# Patient Record
Sex: Male | Born: 1975 | Race: White | Hispanic: No | Marital: Single | Smoking: Former smoker
Health system: Southern US, Community
[De-identification: ages and names within clinical notes are randomized; demographics above are authoritative.]

## PROBLEM LIST (undated history)

## (undated) DIAGNOSIS — R079 Chest pain, unspecified: Secondary | ICD-10-CM

## (undated) DIAGNOSIS — J40 Bronchitis, not specified as acute or chronic: Secondary | ICD-10-CM

## (undated) DIAGNOSIS — I493 Ventricular premature depolarization: Secondary | ICD-10-CM

## (undated) DIAGNOSIS — K311 Adult hypertrophic pyloric stenosis: Secondary | ICD-10-CM

## (undated) HISTORY — DX: Ventricular premature depolarization: I49.3

## (undated) HISTORY — PX: ABDOMINAL SURGERY: SHX537

## (undated) HISTORY — DX: Bronchitis, not specified as acute or chronic: J40

## (undated) HISTORY — DX: Chest pain, unspecified: R07.9

---

## 2011-03-23 ENCOUNTER — Ambulatory Visit: Payer: Self-pay | Admitting: Internal Medicine

## 2011-03-23 ENCOUNTER — Ambulatory Visit: Payer: Self-pay

## 2011-03-23 VITALS — BP 122/86 | HR 89 | Temp 98.1°F | Resp 18 | Ht 66.0 in | Wt 182.0 lb

## 2011-03-23 DIAGNOSIS — R05 Cough: Secondary | ICD-10-CM

## 2011-03-23 DIAGNOSIS — R059 Cough, unspecified: Secondary | ICD-10-CM

## 2011-03-23 DIAGNOSIS — J40 Bronchitis, not specified as acute or chronic: Secondary | ICD-10-CM

## 2011-03-23 MED ORDER — AZITHROMYCIN 500 MG PO TABS: 500.0000 mg | ORAL_TABLET | Freq: Every day | ORAL | Status: AC

## 2011-03-23 MED ORDER — BENZONATATE 100 MG PO CAPS: 100.0000 mg | ORAL_CAPSULE | Freq: Three times a day (TID) | ORAL | Status: AC | PRN

## 2011-03-23 NOTE — Patient Instructions (Signed)
zithromax 500 mg 1 tab daily for 5 days. Tessalon perle 1 tab every 8 hours for cough.

## 2011-03-23 NOTE — Progress Notes (Signed)
  Subjective:    Patient ID: Kristopher Peterson, male    DOB: [birth year], 36 y.o.   MRN: [id number]  HPIcough Onset 3 months ago cough no fever no nv chills sputum is brown and green stopped smoking 2 months ago because of the cough.no weight loss. No night sweats. No chest pain.  Review of Systems  Constitutional: Positive for fatigue.  HENT: Negative.   Eyes: Negative.   Respiratory: Positive for cough.   Cardiovascular: Negative.   Gastrointestinal: Negative.   Genitourinary: Negative.   Musculoskeletal: Negative.   Skin: Negative.   Neurological: Negative.   Hematological: Negative.   Psychiatric/Behavioral: Negative.   All other systems reviewed and are negative.       Objective:   Physical Exam  Constitutional: He is oriented to person, place, and time. He appears well-developed and well-nourished.  HENT:  Head: Normocephalic and atraumatic.  Eyes: Conjunctivae and EOM are normal. Pupils are equal, round, and reactive to light.  Neck: Normal range of motion. Neck supple.  Cardiovascular: Normal rate, regular rhythm and normal heart sounds.   Pulmonary/Chest: Effort normal.       Coarse rhonchi bilat no wheezes  Abdominal: Soft. Bowel sounds are normal.  Musculoskeletal: Normal range of motion.  Neurological: He is alert and oriented to person, place, and time.  Skin: Skin is warm and dry.  Psychiatric: He has a normal mood and affect. His behavior is normal. Judgment and thought content normal.    [facility] reading (PRIMARY) by  Dr. Mindi Junker.increased bronchilal markings rll.  bronchitus.       Assessment & Plan:  Cough Xray Antibiotics Antitussives.

## 2011-10-11 ENCOUNTER — Emergency Department (HOSPITAL_COMMUNITY): Payer: Self-pay

## 2011-10-11 ENCOUNTER — Emergency Department (HOSPITAL_COMMUNITY)
Admission: EM | Admit: 2011-10-11 | Discharge: 2011-10-11 | Disposition: A | Discharge status: Home / Self Care | Payer: Self-pay | Attending: Emergency Medicine | Admitting: Emergency Medicine

## 2011-10-11 ENCOUNTER — Encounter (HOSPITAL_COMMUNITY): Payer: Self-pay | Admitting: *Deleted

## 2011-10-11 DIAGNOSIS — Z87891 Personal history of nicotine dependence: Secondary | ICD-10-CM | POA: Insufficient documentation

## 2011-10-11 DIAGNOSIS — R079 Chest pain, unspecified: Secondary | ICD-10-CM | POA: Insufficient documentation

## 2011-10-11 HISTORY — DX: Adult hypertrophic pyloric stenosis: K31.1

## 2011-10-11 LAB — URINALYSIS, ROUTINE W REFLEX MICROSCOPIC
Ketones, ur: NEGATIVE mg/dL
Leukocytes, UA: NEGATIVE
Nitrite: NEGATIVE
Protein, ur: NEGATIVE mg/dL
Urobilinogen, UA: 0.2 mg/dL (ref 0.0–1.0)

## 2011-10-11 LAB — CBC
HCT: 38.7 % — ABNORMAL LOW (ref 39.0–52.0)
Hemoglobin: 13.6 g/dL (ref 13.0–17.0)
MCHC: 35.1 g/dL (ref 30.0–36.0)

## 2011-10-11 LAB — COMPREHENSIVE METABOLIC PANEL
Alkaline Phosphatase: 79 U/L (ref 39–117)
BUN: 18 mg/dL (ref 6–23)
Chloride: 104 mEq/L (ref 96–112)
GFR calc Af Amer: >90 mL/min (ref >90)
Glucose, Bld: 108 mg/dL — ABNORMAL HIGH (ref 70–99)
Potassium: 3.9 mEq/L (ref 3.5–5.1)
Total Bilirubin: 0.6 mg/dL (ref 0.3–1.2)
Total Protein: 7.2 g/dL (ref 6.0–8.3)

## 2011-10-11 MED ORDER — SODIUM CHLORIDE 0.9 % IV SOLN
INTRAVENOUS | Status: DC
  Administered 2011-10-11: 16:00:00 via INTRAVENOUS

## 2011-10-11 MED ORDER — MORPHINE SULFATE 4 MG/ML IJ SOLN
4.0000 mg | Freq: Once | INTRAMUSCULAR | Status: AC
  Administered 2011-10-11: 4 mg via INTRAVENOUS
  Filled 2011-10-11: qty 1

## 2011-10-11 MED ORDER — SODIUM CHLORIDE 0.9 % IV BOLUS (SEPSIS)
1000.0000 mL | Freq: Once | INTRAVENOUS | Status: AC
  Administered 2011-10-11: 1000 mL via INTRAVENOUS

## 2011-10-11 MED ORDER — NITROGLYCERIN 0.4 MG SL SUBL
0.4000 mg | SUBLINGUAL_TABLET | SUBLINGUAL | Status: DC | PRN
  Administered 2011-10-11 (×2): 0.4 mg via SUBLINGUAL
  Filled 2011-10-11: qty 25

## 2011-10-11 MED ORDER — ASPIRIN 81 MG PO CHEW
324.0000 mg | CHEWABLE_TABLET | Freq: Once | ORAL | Status: AC
  Administered 2011-10-11: 324 mg via ORAL
  Filled 2011-10-11: qty 4

## 2011-10-11 MED ORDER — NITROGLYCERIN 2 % TD OINT
1.0000 [in_us] | TOPICAL_OINTMENT | Freq: Once | TRANSDERMAL | Status: AC
  Administered 2011-10-11: 1 [in_us] via TOPICAL
  Filled 2011-10-11: qty 30

## 2011-10-11 NOTE — ED Provider Notes (Signed)
History     CSN: [id number]  Arrival date & time 10/11/11  1426   First MD Initiated Contact with Patient 10/11/11 1507      Chief Complaint  Patient presents with  . Chest Pain    (Consider location/radiation/quality/duration/timing/severity/associated sxs/prior treatment) HPI Patient is a 36 year old male who presents today complaining of 5/10 chest pain that began approximately 2 hours prior to arrival. Patient describes this as a tightness. He has no associated shortness of breath, nausea, vomiting, or radiation of his pain. Patient has not seen a physician in 13 years. He is not a smoker. Patient reports prior drug use but none recently. Patient does have strong family history of coronary artery disease with both his father and his father's 3 brothers dying of coronary artery disease before the age of 30 and a brother who is 39 who recently had bypass. The patient's knowledge she does not have hypertension, hyperlipidemia, or diabetes. Patient has no history of blood clots in his legs or his lungs. He has been having his pain on and off over the past 2 weeks. Patient has had a recent long car trip from [place] and noted the chest pain during that time. Pain can began just sitting at rest. Patient is hypertensive and tachycardic upon arrival. There are no other associated or modifying factors. Past Medical History  Diagnosis Date  . Pyloric stenosis     Past Surgical History  Procedure Date  . Abdominal surgery     Family History  Problem Relation Age of Onset  . Heart failure Father   . Heart failure Brother     History  Substance Use Topics  . Smoking status: Former Smoker    Quit date: 02/20/2011  . Smokeless tobacco: Current User  . Alcohol Use: Yes     1-2 beers daily      Review of Systems  Constitutional: Positive for fatigue.  HENT: Negative.   Eyes: Negative.   Respiratory: Negative.   Cardiovascular: Positive for chest pain.  Gastrointestinal:  Negative.   Genitourinary: Negative.   Musculoskeletal: Negative.   Skin: Negative.   Neurological: Negative.   Hematological: Negative.   Psychiatric/Behavioral: Negative.   All other systems reviewed and are negative.    Allergies  Review of patient's allergies indicates no known allergies.  Home Medications  No current outpatient prescriptions on file.  BP 143/101  Pulse 122  Temp 98.2 F (36.8 C) (Oral)  Resp 16  SpO2 100%  Physical Exam  Nursing note and vitals reviewed. GEN: Well-developed, well-nourished male in mild distress HEENT: Atraumatic, normocephalic.  EYES: PERRLA BL, no scleral icterus. NECK: Trachea midline, no meningismus CV: Tachycardic with regular rhythm. No murmurs, rubs, or gallops PULM: No respiratory distress.  No crackles, wheezes, or rales. GI: soft, non-tender. No guarding, rebound, or tenderness. + bowel sounds  GU: deferred Neuro: cranial nerves grossly 2-12 intact, no abnormalities of strength or sensation, A and O x 3 MSK: Patient moves all 4 extremities symmetrically, no deformity, edema, or injury noted Skin: No rashes petechiae, purpura, or jaundice Psych: Anxious  ED Course  Procedures (including critical care time)  Indication: chest pain Please note this EKG was reviewed extemporaneously by myself.   Date: 10/11/2011  Rate: 117  Rhythm: normal sinus rhythm  QRS Axis: normal  Intervals: normal  ST/T Wave abnormalities: normal  Conduction Disutrbances:none  Narrative Interpretation:   Old EKG Reviewed: none available    . Labs Reviewed  CBC - Abnormal; Notable for  the following:    HCT 38.7 (*)     All other components within normal limits  COMPREHENSIVE METABOLIC PANEL - Abnormal; Notable for the following:    Glucose, Bld 108 (*)     GFR calc non Af Amer 82 (*)     All other components within normal limits  URINALYSIS, ROUTINE W REFLEX MICROSCOPIC  D-DIMER, QUANTITATIVE  POCT I-STAT TROPONIN I  POCT I-STAT  TROPONIN I   Dg Chest Portable 1 View  10/11/2011  *RADIOLOGY REPORT*  Clinical Data: Chest pain.  PORTABLE CHEST - 1 VIEW  Comparison: 03/23/2011.  Findings: The cardiac silhouette, mediastinal and hilar contours are within normal limits.  The lungs are clear.  No pleural effusion.  The bony thorax is intact.  IMPRESSION: No acute cardiopulmonary findings.   Original Report Authenticated By: P. Loralie Champagne, M.D.      1. Chest pain       MDM  Patient was evaluated by myself. Prior to my valuation nursing chart he provided 325 mg of aspirin by mouth. Based on patient's family history as well as his presentation sublingual nitroglycerin was given. EKG was reviewed and did not show an ST elevation MI. Patient had sinus tachycardia. S1 Q3 was noted without T-wave inversions in lead 3. Patient had a liter of normal saline IV bolus ordered. He has had recent long car trip. D-dimer was performed in addition a cardiac workup. D-dimer was negative and laboratory workup showed no signs of anemia or leukocytosis. Patient had negative troponins at both 0 and 3 hours. Patient had near complete resolution of his pain with nitroglycerin tabs which was concerning given patient's family history. Nitro paste is applied. Patient was given a dose of morphine. Patient made it clear that he preferred not to be admitted today. As patient was pain-free nitro paste is removed. Patient was monitored and his 3 hour troponin was collected. This is negative patient was offered opportunity for transfer to [facility] for chest pain observation protocol. He preferred to followup as an outpatient. [place] our cardiology information was provided. As patient is without symptoms and had only source factor [place] for discharge. Patient was strictly advised return immediately if he had return of his chest pain or other concerning symptoms. Patient was discharged in good condition.        Cyndra Numbers, MD 10/12/11 0110

## 2011-10-11 NOTE — ED Notes (Signed)
Discharge instructions reviewed w/ pt., verbalizes understanding. No prescriptions provided at discharge. NitroStat sent w/ pt w/ instructions for use, sent d/t pt's strong cardiac family Hx

## 2011-10-11 NOTE — ED Notes (Signed)
Dr Alto Denver gave verbal order to remove NTG paste, paste removed

## 2011-10-11 NOTE — Progress Notes (Signed)
[facility] ED CM noted no pcp, self pay [place] resident CM spoke with pt who states he has not seen a provider in a long time.  Previous EPIC notes shows pt seen in 02/2011 by [facility] urgent care.  CM reviewed list of self pay pcps for follow up care, medication resources including needymed.org, discounted pharmacies, financial resources and standard process for ED evaluation Pt and male at bedside voiced understanding and appreciation of resources (written) and services offered

## 2011-10-11 NOTE — Progress Notes (Signed)
Pt reports also applying for services at [facility] clinic also Confirms he is not "service connected" but is eligible to receive general [facility] care services

## 2011-10-18 ENCOUNTER — Emergency Department (HOSPITAL_COMMUNITY): Admission: EM | Admit: 2011-10-18 | Discharge: 2011-10-18 | Discharge status: Against Medical Advice | Payer: Self-pay

## 2011-10-18 ENCOUNTER — Observation Stay (HOSPITAL_COMMUNITY)
Admission: EM | Admit: 2011-10-18 | Discharge: 2011-10-18 | Disposition: A | Discharge status: Home / Self Care | Payer: 59 | Attending: Emergency Medicine | Admitting: Emergency Medicine

## 2011-10-18 ENCOUNTER — Emergency Department (HOSPITAL_COMMUNITY): Payer: Self-pay

## 2011-10-18 ENCOUNTER — Encounter (HOSPITAL_COMMUNITY): Payer: Self-pay | Admitting: Radiology

## 2011-10-18 ENCOUNTER — Observation Stay (HOSPITAL_COMMUNITY): Payer: Self-pay

## 2011-10-18 DIAGNOSIS — R079 Chest pain, unspecified: Secondary | ICD-10-CM

## 2011-10-18 DIAGNOSIS — R0789 Other chest pain: Principal | ICD-10-CM | POA: Insufficient documentation

## 2011-10-18 LAB — CBC WITH DIFFERENTIAL/PLATELET
Eosinophils Absolute: 0.1 10*3/uL (ref 0.0–0.7)
Eosinophils Relative: 1 % (ref 0–5)
Hemoglobin: 13.3 g/dL (ref 13.0–17.0)
Lymphs Abs: 1.5 10*3/uL (ref 0.7–4.0)
MCH: 29.2 pg (ref 26.0–34.0)
MCV: 85.5 fL (ref 78.0–100.0)
Monocytes Relative: 7 % (ref 3–12)
RBC: 4.56 MIL/uL (ref 4.22–5.81)

## 2011-10-18 LAB — BASIC METABOLIC PANEL
BUN: 14 mg/dL (ref 6–23)
CO2: 23 mEq/L (ref 19–32)
Calcium: 9.5 mg/dL (ref 8.4–10.5)
GFR calc non Af Amer: >90 mL/min (ref >90)
Glucose, Bld: 115 mg/dL — ABNORMAL HIGH (ref 70–99)
Potassium: 4 mEq/L (ref 3.5–5.1)

## 2011-10-18 MED ORDER — IOHEXOL 350 MG/ML SOLN
80.0000 mL | Freq: Once | INTRAVENOUS | Status: AC | PRN
Start: 2011-10-18 — End: 2011-10-18
  Administered 2011-10-18: 80 mL via INTRAVENOUS

## 2011-10-18 MED ORDER — METOPROLOL TARTRATE 1 MG/ML IV SOLN
5.0000 mg | Freq: Once | INTRAVENOUS | Status: AC
  Administered 2011-10-18: 5 mg via INTRAVENOUS
  Filled 2011-10-18: qty 5

## 2011-10-18 MED ORDER — METOPROLOL TARTRATE 1 MG/ML IV SOLN
2.5000 mg | Freq: Once | INTRAVENOUS | Status: AC
  Administered 2011-10-18: 2.5 mg via INTRAVENOUS

## 2011-10-18 MED ORDER — METOPROLOL TARTRATE 25 MG PO TABS
100.0000 mg | ORAL_TABLET | Freq: Once | ORAL | Status: AC
  Administered 2011-10-18: 100 mg via ORAL
  Filled 2011-10-18: qty 4

## 2011-10-18 MED ORDER — NITROGLYCERIN 0.4 MG SL SUBL
0.4000 mg | SUBLINGUAL_TABLET | Freq: Once | SUBLINGUAL | Status: AC
  Administered 2011-10-18: 0.4 mg via SUBLINGUAL

## 2011-10-18 NOTE — ED Provider Notes (Signed)
History     CSN: [id number]  Arrival date & time 10/18/11  5284   First MD Initiated Contact with Patient 10/18/11 0901      Chief Complaint  Patient presents with  . Chest Pain     Patient is a 36 y.o. male presenting with chest pain. The history is provided by the patient.  Chest Pain Episode onset: several weeks ago. Chest pain occurs constantly. The chest pain is improving. Associated with: movement and deep breathing. At its most intense, the pain is at 2/10. The quality of the pain is described as aching. The pain does not radiate. Chest pain is worsened by deep breathing and certain positions. Primary symptoms include shortness of breath. Pertinent negatives for primary symptoms include no fever, no syncope, no cough, no palpitations, no abdominal pain, no vomiting and no dizziness.  Pertinent negatives for associated symptoms include no near-syncope. He tried aspirin for the symptoms.   pt reports he has actually had CP for several weeks, with some worsening in the middle of the night, but it is now improving He reports some worsening with breathing and some worsening with movement/exertion He is now feeling improved  Past Medical History  Diagnosis Date  . Pyloric stenosis     Past Surgical History  Procedure Date  . Abdominal surgery     Family History  Problem Relation Age of Onset  . Heart failure Father   . Heart failure Brother     History  Substance Use Topics  . Smoking status: Former Smoker    Quit date: 02/20/2011  . Smokeless tobacco: Current User  . Alcohol Use: Yes     1-2 beers daily      Review of Systems  Constitutional: Negative for fever.  Respiratory: Positive for shortness of breath. Negative for cough.   Cardiovascular: Positive for chest pain. Negative for palpitations, syncope and near-syncope.  Gastrointestinal: Negative for vomiting and abdominal pain.  Neurological: Negative for dizziness.  All other systems reviewed and are  negative.    Allergies  Review of patient's allergies indicates no known allergies.  Home Medications   Current Outpatient Rx  Name Route Sig Dispense Refill  . NITROGLYCERIN 0.4 MG SL SUBL Sublingual Place 0.4 mg under the tongue every 5 (five) minutes as needed. For chest pain      BP 147/106  Pulse 79  Temp 99 F (37.2 C) (Oral)  Resp 18  SpO2 96%  Physical Exam CONSTITUTIONAL: Well developed/well nourished HEAD AND FACE: Normocephalic/atraumatic EYES: EOMI/PERRL ENMT: Mucous membranes moist NECK: supple no meningeal signs SPINE:entire spine nontender CV: S1/S2 noted, no murmurs/rubs/gallops noted LUNGS: Lungs are clear to auscultation bilaterally, no apparent distress ABDOMEN: soft, nontender, no rebound or guarding GU:no cva tenderness NEURO: Pt is awake/alert, moves all extremitiesx4 EXTREMITIES: pulses normal, full ROM, no calf tenderness or erythema SKIN: warm, color normal PSYCH: no abnormalities of mood noted  ED Course  Procedures    Labs Reviewed  CBC WITH DIFFERENTIAL  BASIC METABOLIC PANEL  TROPONIN I  TROPONIN I  TROPONIN I   9:20 AM Pt HEART Score with negative troponin less than 3.  He has already had negative ddimer recently He reports he is feeling improved at this time, and does not want any meds.  He already took ASA today He likely has untreated HTN.  Strong family history but no other risk factors (denies smoking or any drug use) EKG is unchanged, will repeat CXR to ensure no change I doubt PE  I doubt aortic dissection   11:09 AM intiial troponin negative D/w [facility] [place] Will monitor, recheck ekg/troponin at 12pm.  If negative can be discharged with close cardiology followup Do not feel he requires provocative imaging at this time   MDM  Nursing notes including past medical history and social history reviewed and considered in documentation Previous records reviewed and considered Labs/vital reviewed and considered xrays  reviewed and considered        Date: 10/18/2011  Rate: 84  Rhythm: normal sinus rhythm  QRS Axis: normal  Intervals: normal  ST/T Wave abnormalities: normal  Conduction Disutrbances:none  Narrative Interpretation:   Old EKG Reviewed: unchanged    Joya Gaskins, MD 10/18/11 1109

## 2011-10-18 NOTE — ED Notes (Signed)
Spoke with CT about coranary CT. Metoprolol ordered from pharmacy

## 2011-10-18 NOTE — ED Notes (Signed)
Pt remains anxious stating that he wishes that he had stayed at [facility] on Tuesday when they wanted to admit him so that he would know what is wrong with his chest

## 2011-10-18 NOTE — ED Provider Notes (Signed)
Medical screening examination/treatment/procedure(s) were performed by non-physician practitioner and as supervising physician I was immediately available for consultation/collaboration.   Caiya Bettes, MD 10/18/11 2321 

## 2011-10-18 NOTE — Consult Note (Signed)
CARDIOLOGY CONSULT NOTE    Patient ID: Kristopher Peterson MRN: [id number] DOB/AGE: [birth year] 36 y.o.  Admit date: 10/18/2011 Referring Physician:  ER Primary Physician: No primary provider on file. Primary Cardiologist:  New Has appt with me 9/25 Reason for Consultation:  Chest Pain  Active Problems:  * No active hospital problems. *    HPI:   36 yo with history of pyloric surgery at age 66  Seen in ER  9/16 for chest pain  R/O.  Pain atypical Been going on for 2 weeks  Hurts his chest and back.  No fever just myalgia.  No functional studies done Seen back today for onoing pain.  Previous history of drug use denies.  No fever cough.  CXR only mild pectus with NAD and normal mediatinum  Positive family history at premature age and brother just had 3 stents put in  Denies recent trauma.  No history of connective tissue disease.  Did have recent car trip to [place] but no LE edema.  BP has been labile.  Took a nitro on Tuesday when his chest hurt and had elevated BP  Just gave him a headache with  No relief of pain.    @ROS @ All other systems reviewed and negative except as noted above  Past Medical History  Diagnosis Date  . Pyloric stenosis     Family History  Problem Relation Age of Onset  . Heart failure Father   . Heart failure Brother     History   Social History  . Marital Status: Single    Spouse Name: N/A    Number of Children: N/A  . Years of Education: N/A   Occupational History  . Not on file.   Social History Main Topics  . Smoking status: Former Smoker    Quit date: 02/20/2011  . Smokeless tobacco: Current User  . Alcohol Use: Yes     1-2 beers daily  . Drug Use: No  . Sexually Active: Not on file   Other Topics Concern  . Not on file   Social History Narrative  . No narrative on file    Past Surgical History  Procedure Date  . Abdominal surgery         . metoprolol tartrate  100 mg Oral Once      Physical Exam:  Affect appropriate Healthy:   appears stated age HEENT: normal Neck supple with no adenopathy JVP normal no bruits no thyromegaly Lungs clear with no wheezing and good diaphragmatic motion Heart:  S1/S2 no murmur, no rub, gallop or click PMI normal Abdomen: benighn, BS positve, no tenderness, no AAA no bruit.  No HSM or HJR Distal pulses intact with no bruits No edema Neuro non-focal Skin warm and dry No muscular weakness   Labs:   Lab Results  Component Value Date   WBC 7.2 10/18/2011   HGB 13.3 10/18/2011   HCT 39.0 10/18/2011   MCV 85.5 10/18/2011   PLT 250 10/18/2011    Lab 10/18/11 0910  NA 138  K 4.0  CL 103  CO2 23  BUN 14  CREATININE 0.90  CALCIUM 9.5  PROT --  BILITOT --  ALKPHOS --  ALT --  AST --  GLUCOSE 115*   Lab Results  Component Value Date   TROPONINI <0.30 10/18/2011       Radiology: Dg Chest 2 View  10/18/2011  *RADIOLOGY REPORT*  Clinical Data: Chest pain.  Left axillary pain.  Right hand numbness.  Dizziness to  3 weeks.  CHEST - 2 VIEW  Comparison: 10/11/2011  Findings: A minimal pectus excavatum deformity. Midline trachea. Normal heart size and mediastinal contours. No pleural effusion or pneumothorax.  Clear lungs.  IMPRESSION: No acute cardiopulmonary disease.   Original Report Authenticated By: Consuello Bossier, M.D.    Dg Chest Portable 1 View  10/11/2011  *RADIOLOGY REPORT*  Clinical Data: Chest pain.  PORTABLE CHEST - 1 VIEW  Comparison: 03/23/2011.  Findings: The cardiac silhouette, mediastinal and hilar contours are within normal limits.  The lungs are clear.  No pleural effusion.  The bony thorax is intact.  IMPRESSION: No acute cardiopulmonary findings.   Original Report Authenticated By: P. Loralie Champagne, M.D.     EKG:  Today NSR rate 84 PVC normal no pericardial changes.  9/17  NSR normal ECG  ASSESSMENT AND PLAN:   Recurrent chest pain with positive family history . Will arrange cardiac CT has part of CDU protocol.  If no CAD or other chest abnormality will  keep appt with me on 9/25 with echo before visit To assess pericardium.  If no CAD consider Rx with high dose NSAI and colchicine.  Got oral Toprol in ER and I just gave him 5mg  iv lopresser  HR 68.  Patient comfotable going home and seeing  Me as outpatient if CTA shows no CAD   Signed: Charlton Haws 10/18/2011, 3:16 PM

## 2011-10-18 NOTE — ED Provider Notes (Signed)
1:24 PM Pt moved to CDU holding for repeat troponin and ekg.  Sign out received from Dr Bebe Shaggy.  Pt with recurrent chest pain, seen two weeks ago with negative workup, continues to have intermittent pain both at rest and with exertion.  Repeat EKG is unremarkable, unchanged from prior with exception of lack of PVCs on new EKG.  Troponin is negative.  I have spoken with Theodore Demark, PA, of [place] cardiology, who will set him up with outpatient follow up.    1:46 PM I spoke with patient and discussed all results with him.  Pt is very concerned about being discharged home, reports all of the males on his father's side died of massive MIs and his older brother recently had three stents placed and always had normal ekgs and negative markers but was then found to have a 90% blockage.  I have discussed patient's concerns with Dr Bebe Shaggy who agrees to have patient stay on chest pain protocol.  I have discussed the plan with patient who agrees.  Lupita Leash, RN, has called and pt may be able to have test done this afternoon.  Patient is A&Ox4, NAD, RRR, no m/r/g, chest nontender, lungs CTAB, abd soft, NT, extremities without edema, distal pulses intact.   3:47 PM Patient signed out to [facility] [place], who assumes care of patient at change of shift.  Pt is on Chest Pain Protocol - will have Coronary CT this afternoon.    Results for orders placed during the hospital encounter of 10/18/11  CBC WITH DIFFERENTIAL      Component Value Range   WBC 7.2  4.0 - 10.5 K/uL   RBC 4.56  4.22 - 5.81 MIL/uL   Hemoglobin 13.3  13.0 - 17.0 g/dL   HCT 21.3  08.6 - 57.8 %   MCV 85.5  78.0 - 100.0 fL   MCH 29.2  26.0 - 34.0 pg   MCHC 34.1  30.0 - 36.0 g/dL   RDW 46.9  62.9 - 52.8 %   Platelets 250  150 - 400 K/uL   Neutrophils Relative 71  43 - 77 %   Neutro Abs 5.1  1.7 - 7.7 K/uL   Lymphocytes Relative 21  12 - 46 %   Lymphs Abs 1.5  0.7 - 4.0 K/uL   Monocytes Relative 7  3 - 12 %   Monocytes Absolute 0.5  0.1 - 1.0  K/uL   Eosinophils Relative 1  0 - 5 %   Eosinophils Absolute 0.1  0.0 - 0.7 K/uL   Basophils Relative 0  0 - 1 %   Basophils Absolute 0.0  0.0 - 0.1 K/uL  BASIC METABOLIC PANEL      Component Value Range   Sodium 138  135 - 145 mEq/L   Potassium 4.0  3.5 - 5.1 mEq/L   Chloride 103  96 - 112 mEq/L   CO2 23  19 - 32 mEq/L   Glucose, Bld 115 (*) 70 - 99 mg/dL   BUN 14  6 - 23 mg/dL   Creatinine, Ser 4.13  0.50 - 1.35 mg/dL   Calcium 9.5  8.4 - 24.4 mg/dL   GFR calc non Af Amer >90  >90 mL/min   GFR calc Af Amer >90  >90 mL/min  TROPONIN I      Component Value Range   Troponin I <0.30  <0.30 ng/mL  TROPONIN I      Component Value Range   Troponin I <0.30  <0.30 ng/mL  Dg Chest 2 View  10/18/2011  *RADIOLOGY REPORT*  Clinical Data: Chest pain.  Left axillary pain.  Right hand numbness.  Dizziness to 3 weeks.  CHEST - 2 VIEW  Comparison: 10/11/2011  Findings: A minimal pectus excavatum deformity. Midline trachea. Normal heart size and mediastinal contours. No pleural effusion or pneumothorax.  Clear lungs.  IMPRESSION: No acute cardiopulmonary disease.   Original Report Authenticated By: Consuello Bossier, M.D.    Dg Chest Portable 1 View  10/11/2011  *RADIOLOGY REPORT*  Clinical Data: Chest pain.  PORTABLE CHEST - 1 VIEW  Comparison: 03/23/2011.  Findings: The cardiac silhouette, mediastinal and hilar contours are within normal limits.  The lungs are clear.  No pleural effusion.  The bony thorax is intact.  IMPRESSION: No acute cardiopulmonary findings.   Original Report Authenticated By: P. Loralie Champagne, M.D.       [place], [place] 10/18/11 1547

## 2011-10-18 NOTE — ED Notes (Signed)
Patient transported to X-ray 

## 2011-10-18 NOTE — Progress Notes (Signed)
Outpatient cardiology follow-up requested. Appointment made, on d/c paperwork.

## 2011-10-18 NOTE — ED Provider Notes (Signed)
5:10 PM  Patient is to be discharged with recommendation to follow up with PCP in regards to today's hospital visit. Pts symptoms unlikely to be of CAD etiology and pt has not reported any CP while in my care in the CDU. Labs and imaging reviewed again prior to dc. CXR and ECG with no acute abnormalities, normal CTangio as below (discussed with Dr. Eden Emms), and neg troponins x2. Pt has been advised return to the ED if develop any exertional CP- strict return precautions discussed & patient's questions answered. Pt appears reliable for follow up and is agreeable to discharge.   CT Heart Morp w/Cta  FINALImpression: 1) Calcium Score: 0 2) Normal left dominant coronary arteries 3) Normal ascending aorta 4) No proximal pulmonary emboli 5) Small RLL near pleural based lung nodule see report from [facility] radiology Dr. Fredirick Lathe MD [facility]       [place], [place] 10/18/11 1712

## 2011-10-18 NOTE — ED Notes (Signed)
To ED for eval of CP. Pt states he has had intermittent CP for the past year and seen at a cple ED's for same. States he was last at [facility] and they wanted him to transfer to [facility] for further workup but pt declined. States when pain starts with any exertion and continues to get worse.

## 2011-10-19 ENCOUNTER — Encounter: Payer: Self-pay | Admitting: Cardiovascular Disease

## 2011-10-19 ENCOUNTER — Telehealth: Payer: Self-pay | Admitting: Cardiovascular Disease

## 2011-10-19 ENCOUNTER — Encounter: Payer: Self-pay | Admitting: *Deleted

## 2011-10-19 DIAGNOSIS — J40 Bronchitis, not specified as acute or chronic: Secondary | ICD-10-CM | POA: Insufficient documentation

## 2011-10-19 DIAGNOSIS — K311 Adult hypertrophic pyloric stenosis: Secondary | ICD-10-CM | POA: Insufficient documentation

## 2011-10-20 ENCOUNTER — Ambulatory Visit (HOSPITAL_COMMUNITY): Payer: Self-pay | Attending: Cardiovascular Disease | Admitting: Radiology

## 2011-10-20 ENCOUNTER — Ambulatory Visit: Payer: Self-pay | Admitting: Cardiovascular Disease

## 2011-10-20 ENCOUNTER — Ambulatory Visit (INDEPENDENT_AMBULATORY_CARE_PROVIDER_SITE_OTHER): Payer: Self-pay | Admitting: Cardiovascular Disease

## 2011-10-20 ENCOUNTER — Encounter: Payer: Self-pay | Admitting: Cardiovascular Disease

## 2011-10-20 ENCOUNTER — Other Ambulatory Visit (HOSPITAL_COMMUNITY): Payer: Self-pay | Admitting: Cardiovascular Disease

## 2011-10-20 VITALS — BP 139/88 | HR 78 | Ht 66.0 in | Wt 182.0 lb

## 2011-10-20 DIAGNOSIS — Z8249 Family history of ischemic heart disease and other diseases of the circulatory system: Secondary | ICD-10-CM | POA: Insufficient documentation

## 2011-10-20 DIAGNOSIS — I493 Ventricular premature depolarization: Secondary | ICD-10-CM

## 2011-10-20 DIAGNOSIS — I4949 Other premature depolarization: Secondary | ICD-10-CM

## 2011-10-20 DIAGNOSIS — R079 Chest pain, unspecified: Secondary | ICD-10-CM

## 2011-10-20 DIAGNOSIS — R072 Precordial pain: Secondary | ICD-10-CM | POA: Insufficient documentation

## 2011-10-20 NOTE — Assessment & Plan Note (Signed)
Atypical Improved normal cardiac CT  Observe

## 2011-10-20 NOTE — ED Provider Notes (Signed)
Medical screening examination/treatment/procedure(s) were conducted as a shared visit with non-physician practitioner(s) and myself.  I personally evaluated the patient during the encounter   Joya Gaskins, MD 10/20/11 1358

## 2011-10-20 NOTE — Progress Notes (Signed)
Patient ID: Kristopher Peterson, male   DOB: 1975-05-23, 36 y.o.    MRN: [id number] 36 yo seen in ER 48 hours ago.  Atypical chest pain  R/O Cardiac CT with calcium score 0 and CTA showing normal left dominant cors.  Some motion artifact  He had an echo today with low normla EF and no RWMA;s and no effusion.  Pain improved.  Still anxious about his heart.  No fever, pleurisy or muscular component.  Noted PVC on echo at office and on ECG 9/23  Asymptomatic with no palpitaitons  ROS: Denies fever, malais, weight loss, blurry vision, decreased visual acuity, cough, sputum, SOB, hemoptysis, pleuritic pain, palpitaitons, heartburn, abdominal pain, melena, lower extremity edema, claudication, or rash.  All other systems reviewed and negative  General: Affect appropriate Healthy:  appears stated age HEENT: normal Neck supple with no adenopathy JVP normal no bruits no thyromegaly Lungs clear with no wheezing and good diaphragmatic motion Heart:  S1/S2 no murmur, no rub, gallop or click PMI normal Abdomen: benighn, BS positve, no tenderness, no AAA no bruit.  No HSM or HJR Distal pulses intact with no bruits No edema Neuro non-focal Skin warm and dry No muscular weakness   No current outpatient prescriptions on file.    Allergies  Review of patient's allergies indicates no known allergies.  Electrocardiogram:  9/23  NSR rate 84 normal ECG PVC  Assessment and Plan

## 2011-10-20 NOTE — Patient Instructions (Addendum)
Your physician recommends that you schedule a follow-up appointment in:  3 MONTHS WITH  DR NISHAN  Your physician recommends that you continue on your current medications as directed. Please refer to the Current Medication list given to you today.  

## 2011-10-20 NOTE — Progress Notes (Signed)
Echocardiogram performed.  

## 2011-10-20 NOTE — Assessment & Plan Note (Signed)
Asymptomatic low normal EF on echo  Observe  Event monitor if becomes symptomatic

## 2012-01-13 ENCOUNTER — Encounter: Payer: Self-pay | Admitting: *Deleted

## 2012-01-14 ENCOUNTER — Encounter: Payer: Self-pay | Admitting: Cardiovascular Disease

## 2012-01-14 NOTE — Progress Notes (Signed)
Patient ID: Kristopher Peterson, male   DOB: 04-22-1975, 36 y.o.   MRN: [id number] 36 yo seen in ER 9/13. Atypical chest pain R/O Cardiac CT with calcium score 0 and CTA showing normal left dominant cors. Some motion artifact He had an echo also with EF 55% and no RWMA;s and no effusion. Pain improved. Still anxious about his heart. No fever, pleurisy or muscular component. Noted PVC on echo at office and on ECG 9/23 Asymptomatic with no palpitaitons   Study Conclusions  10/20/11  - Left ventricle: Technically difficult study. There is no septal bounce. The cavity size was normal. Wall thickness was normal. The estimated ejection fraction was 55%. Wall motion was normal; there were no regional wall motion abnormalities. - Pericardium, extracardiac: There was no pericardial Effusion.  ROS: Denies fever, malais, weight loss, blurry vision, decreased visual acuity, cough, sputum, SOB, hemoptysis, pleuritic pain, palpitaitons, heartburn, abdominal pain, melena, lower extremity edema, claudication, or rash.  All other systems reviewed and negative  General: Affect appropriate Healthy:  appears stated age HEENT: normal Neck supple with no adenopathy JVP normal no bruits no thyromegaly Lungs clear with no wheezing and good diaphragmatic motion Heart:  S1/S2 no murmur, no rub, gallop or click PMI normal Abdomen: benighn, BS positve, no tenderness, no AAA no bruit.  No HSM or HJR Distal pulses intact with no bruits No edema Neuro non-focal Skin warm and dry No muscular weakness   No current outpatient prescriptions on file.    Allergies  Review of patient's allergies indicates no known allergies.  Electrocardiogram:  10/20/11 SR rate 82 PVC  Assessment and Plan

## 2013-03-26 ENCOUNTER — Ambulatory Visit (HOSPITAL_COMMUNITY): Admission: RE | Admit: 2013-03-26 | Payer: Self-pay | Source: Home / Self Care | Admitting: Psychiatry

## 2013-07-22 IMAGING — CT CT HEART MORP W/ CTA COR W/ SCORE W/ CA W/CM &/OR W/O CM
2 of 7 series · 10 of 20 positions shown, 12 images · non-contrast
Comparison: None

***ADDENDUM*** CREATED: 10/19/2011 [DATE]

OVER-READ INTERPRETATION - CT CHEST
The following report is an over-read performed by radiologist Dr.
[DATE].  This over-read does not include interpretation of
cardiac or coronary anatomy or pathology.  The CTA interpretation
by the cardiologist is attached.
INDICATION: Recurrent chest pain with positive family history
PROTOCOL: The patient was scanned on a [employer] 256 scanner.
Collimation was .9mm and gantry rotation speed was 270 msec.
Average HR during scan was 54 bpm.  He received oral Toprol 100mg
and 10 mg of iv lopresser as well as SL nitro.  After initial AP
and lateral topogram non contrast calcium scoring was done.  The
patient then received 80cc of contrast for CTA triggered at 111 HU
in the descending thoracic aorta.  A 100 kV prospective scan was
done with 5% padding around 78% of the R-R interval.  The 3D data
set was reviewed using MIP, MPR and VRT modes on a dedicated
[employer] work-station.

[Series 8: w/ edge cor., 78.0% · axial · 0.49mm/px · z∈[-230,-122]mm · 5 of 363 slices shown, 7 images]
[im 61/363  vessel]
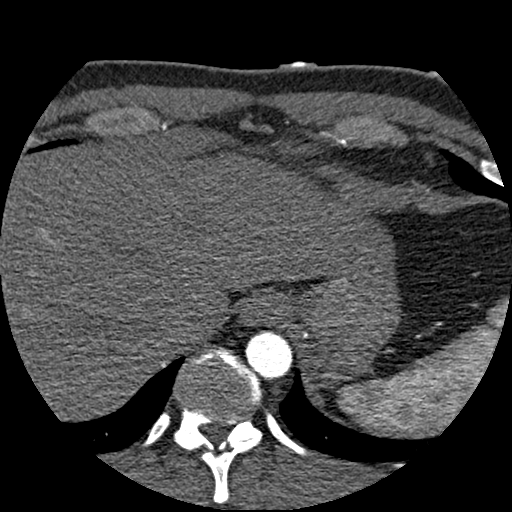
[im 61/363  lung]
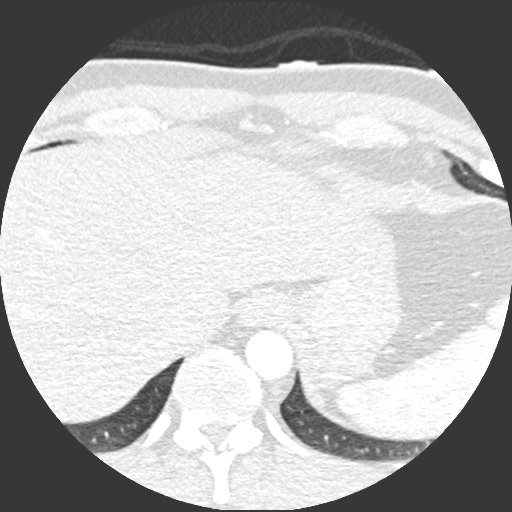
[im 121/363  vessel]
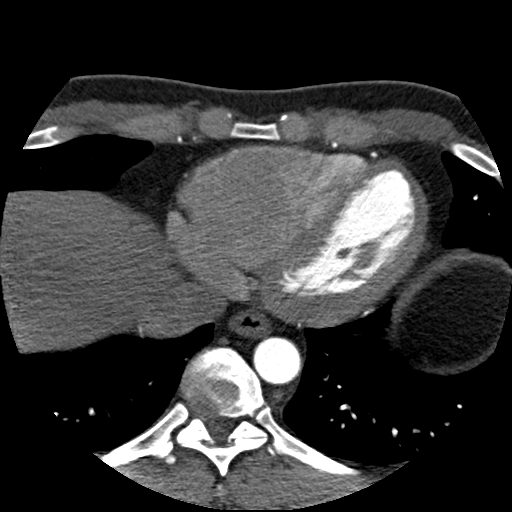
[im 182/363  vessel]
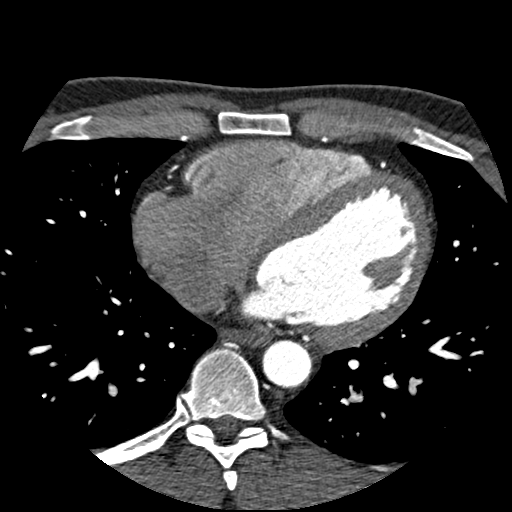
[im 242/363  vessel]
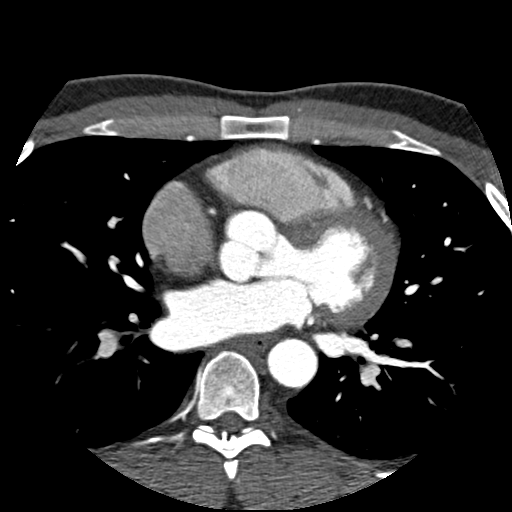
[im 302/363  vessel]
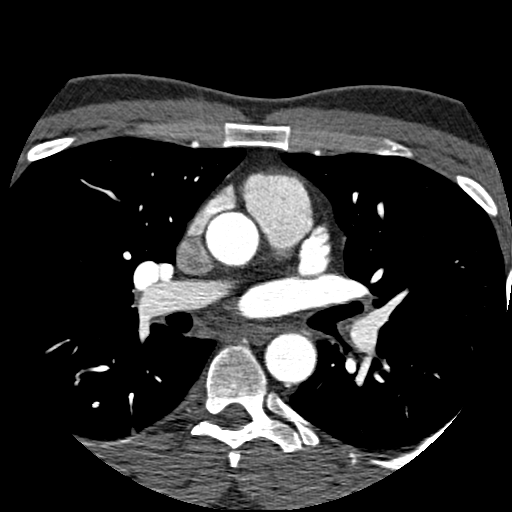
[im 302/363  lung]
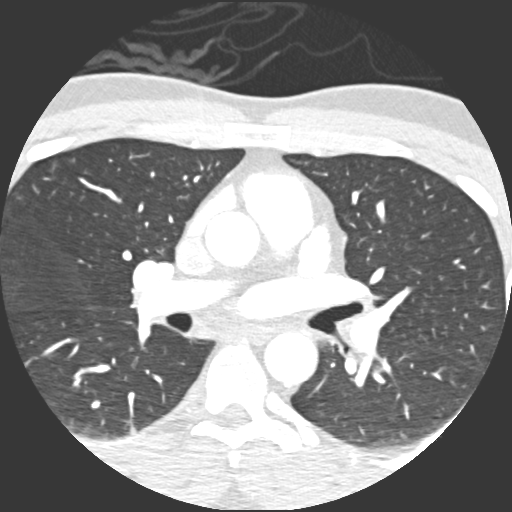

[Series 9: w/o edge corr., 78.0% · axial · non-contrast · 0.49mm/px · z∈[-230,-122]mm · 5 of 363 slices shown]
[im 61/363  lung]
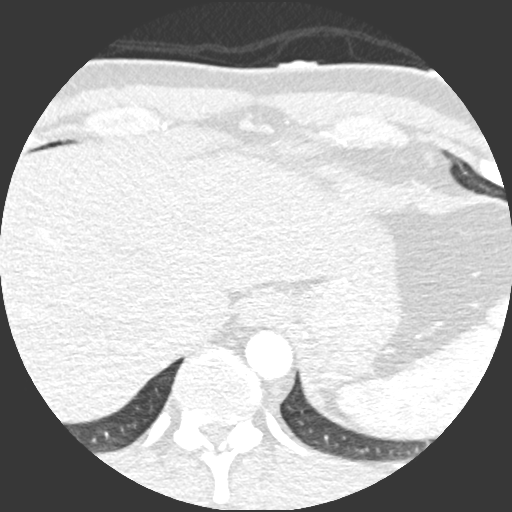
[im 121/363  lung]
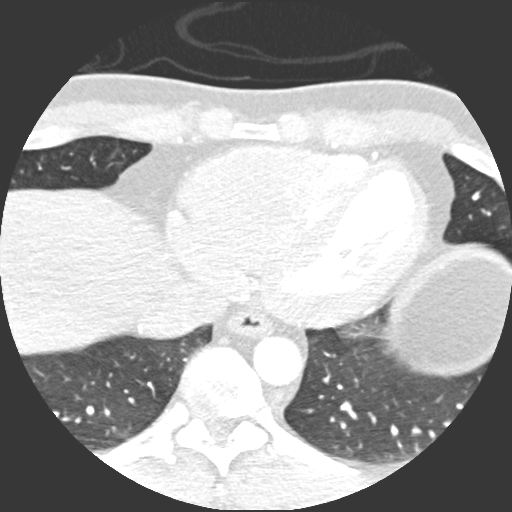
[im 182/363  lung]
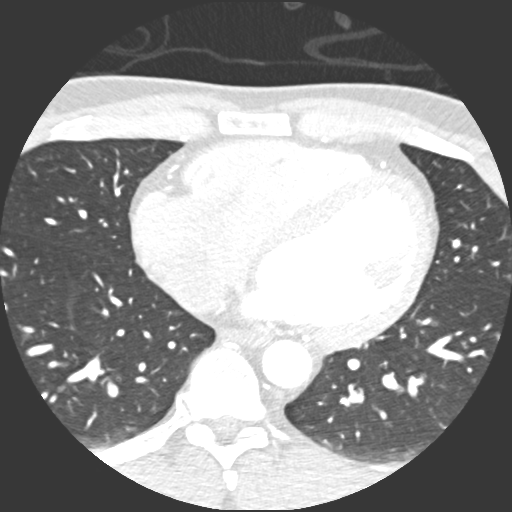
[im 242/363  lung]
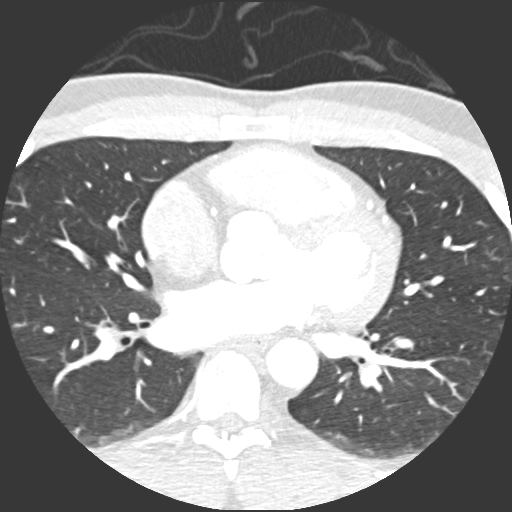
[im 302/363  lung]
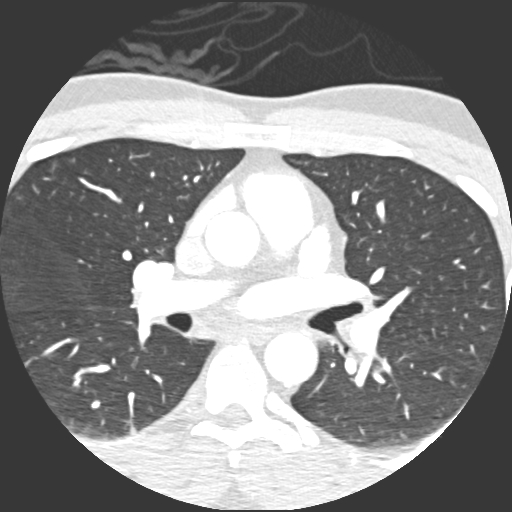

[10 of 20 positions shown; findings below may reference images not displayed]

FINDINGS: 4 mm nodule seen in the posterior left lower lobe on
image 18 of series 4.  Visualized lungs otherwise clear.  No
pleural effusions.  No adenopathy in the visualized lower
mediastinum or hila. Imaging into the upper abdomen shows no acute
findings.  No acute bony abnormality.
IMPRESSION: 4 mm posterior left lower lobe nodule. If the patient is at high
risk for bronchogenic carcinoma, follow-up chest CT at 1 year is
recommended.  If the patient is at low risk, no follow-up is
needed.  This recommendation follows the consensus statement:
Guidelines for Management of Small Pulmonary Nodules Detected on CT
Scans:  A Statement from the [HOSPITAL] as published in

***END ADDENDUM*** SIGNED BY: Rantona Bhebhe, M.D.
Cardiac CT:
FINDINGS: Calcium Score: 0

Noncardiac:  See separate report from [REDACTED]. Small
RLL near pleural based nodule.

Coronary Arteries:  Left  dominant with no anomalies

LM- Normal

LAD- Normal some motion artifact in mid vessel large vessel wraps
apex and supplies most of PDA territory

      D1: large and normal some motion artifact in mid vessel

Cirucmflex:  Normal

      OM1: small and normal

      OM2: large and normal

      PDA/PLA:  small and normal

RCA:  Small nondominant and normal.

The pericardium appeared normal.  There was no effusion.  The
ascending aorta was normal at 2.7 cm axially at the level of the
RPA.  There was no evidence of proximal pulmonary emboli
IMPRESSION: 1)    Calcium Score: 0

2)    Normal left dominant coronary arteries
3)    Normal ascending aorta
4)    No proximal pulmonary emboli
5)    Small RLL near pleural based lung nodule see report from
[REDACTED] Dr. Marevel
